# Patient Record
Sex: Male | Born: 2000 | Race: Black or African American | Hispanic: No | Marital: Single | Smoking: Former smoker
Health system: Southern US, Community
[De-identification: ages and names within clinical notes are randomized; demographics above are authoritative.]

## PROBLEM LIST (undated history)

## (undated) DIAGNOSIS — D571 Sickle-cell disease without crisis: Secondary | ICD-10-CM

---
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
## 2019-03-23 IMAGING — DX DG CHEST 2V
2 series · 2 of 2 positions shown · non-contrast
Comparison: 08/28/2017

CLINICAL DATA: Sickle cell crisis

EXAM:
CHEST - 2 VIEW

[chest pa]
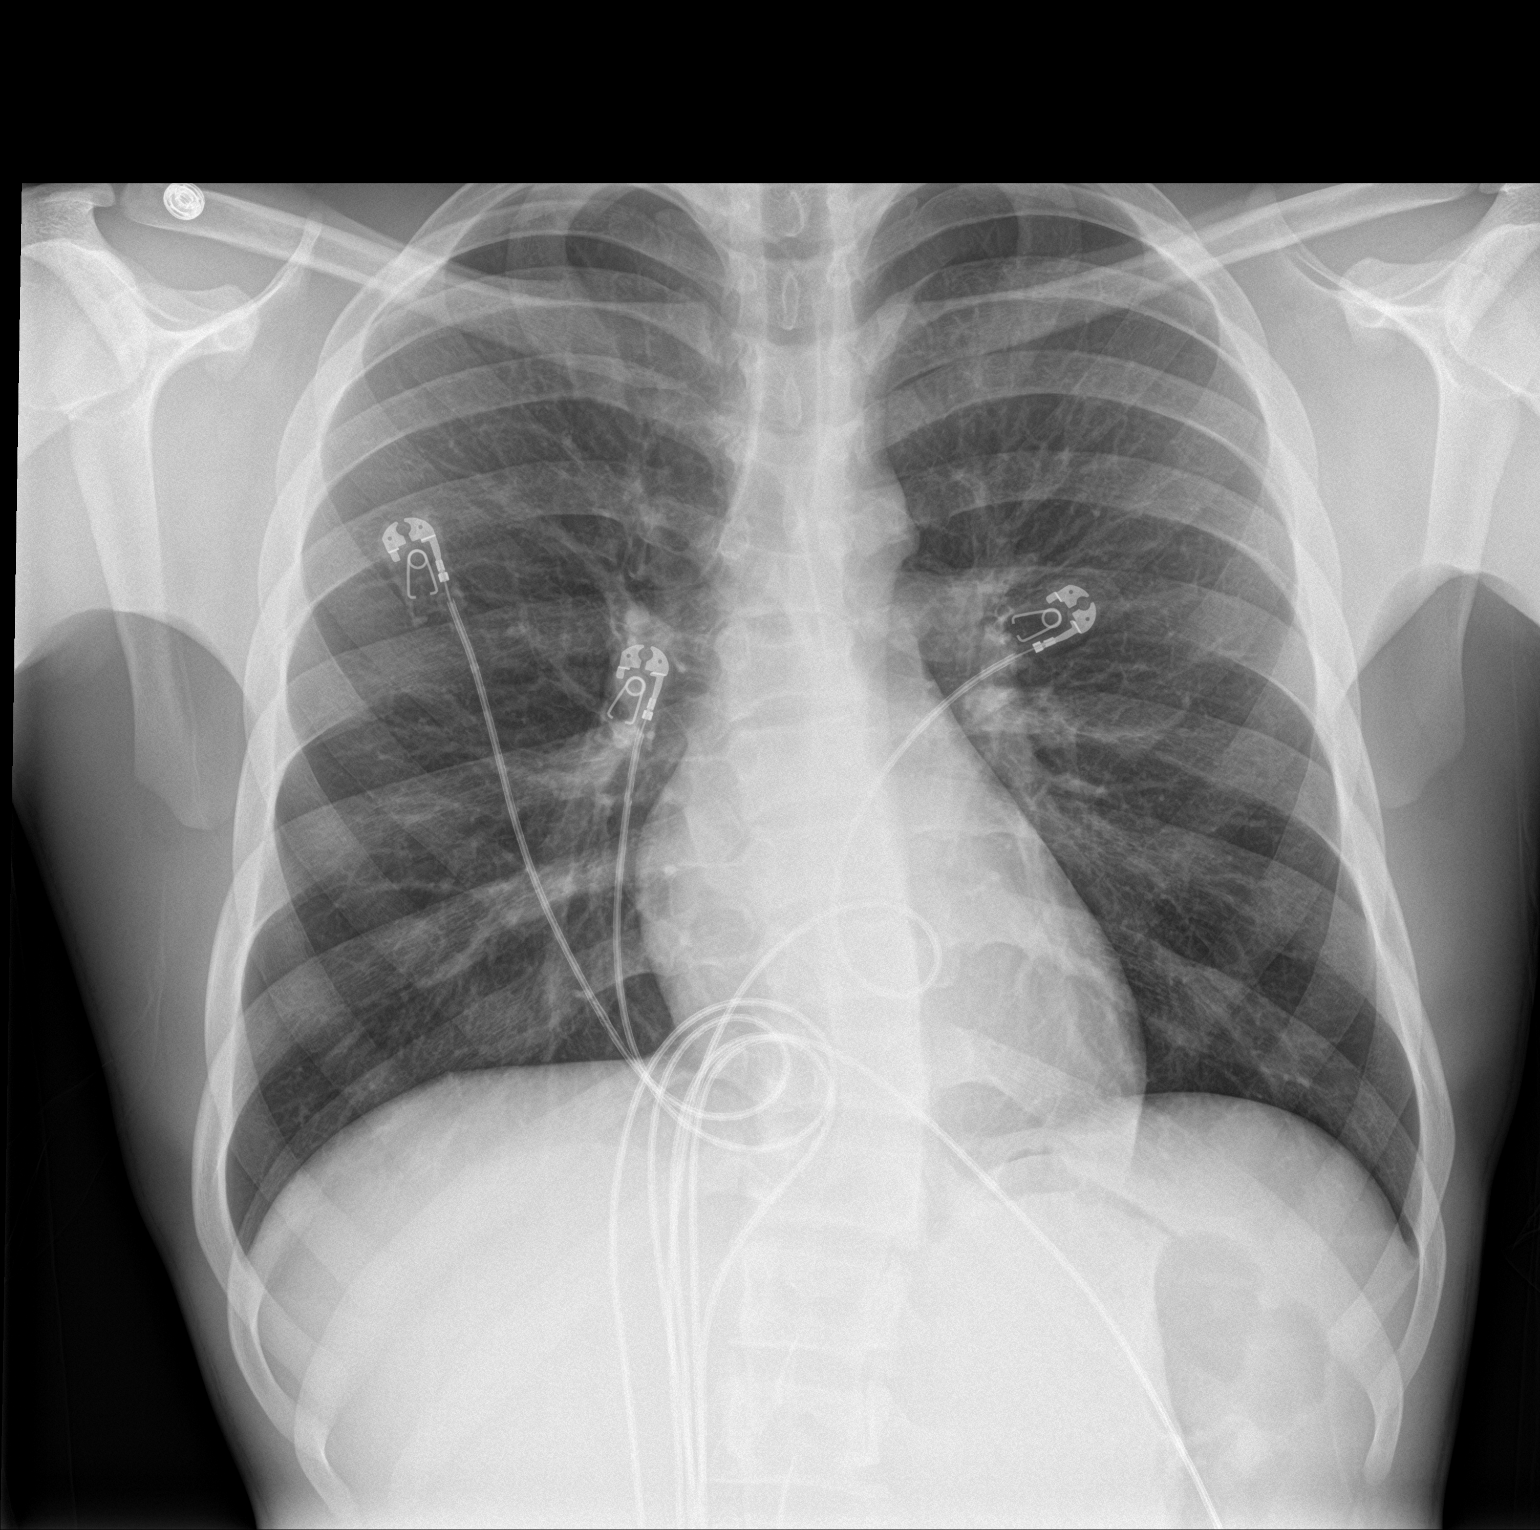

[chest lat]
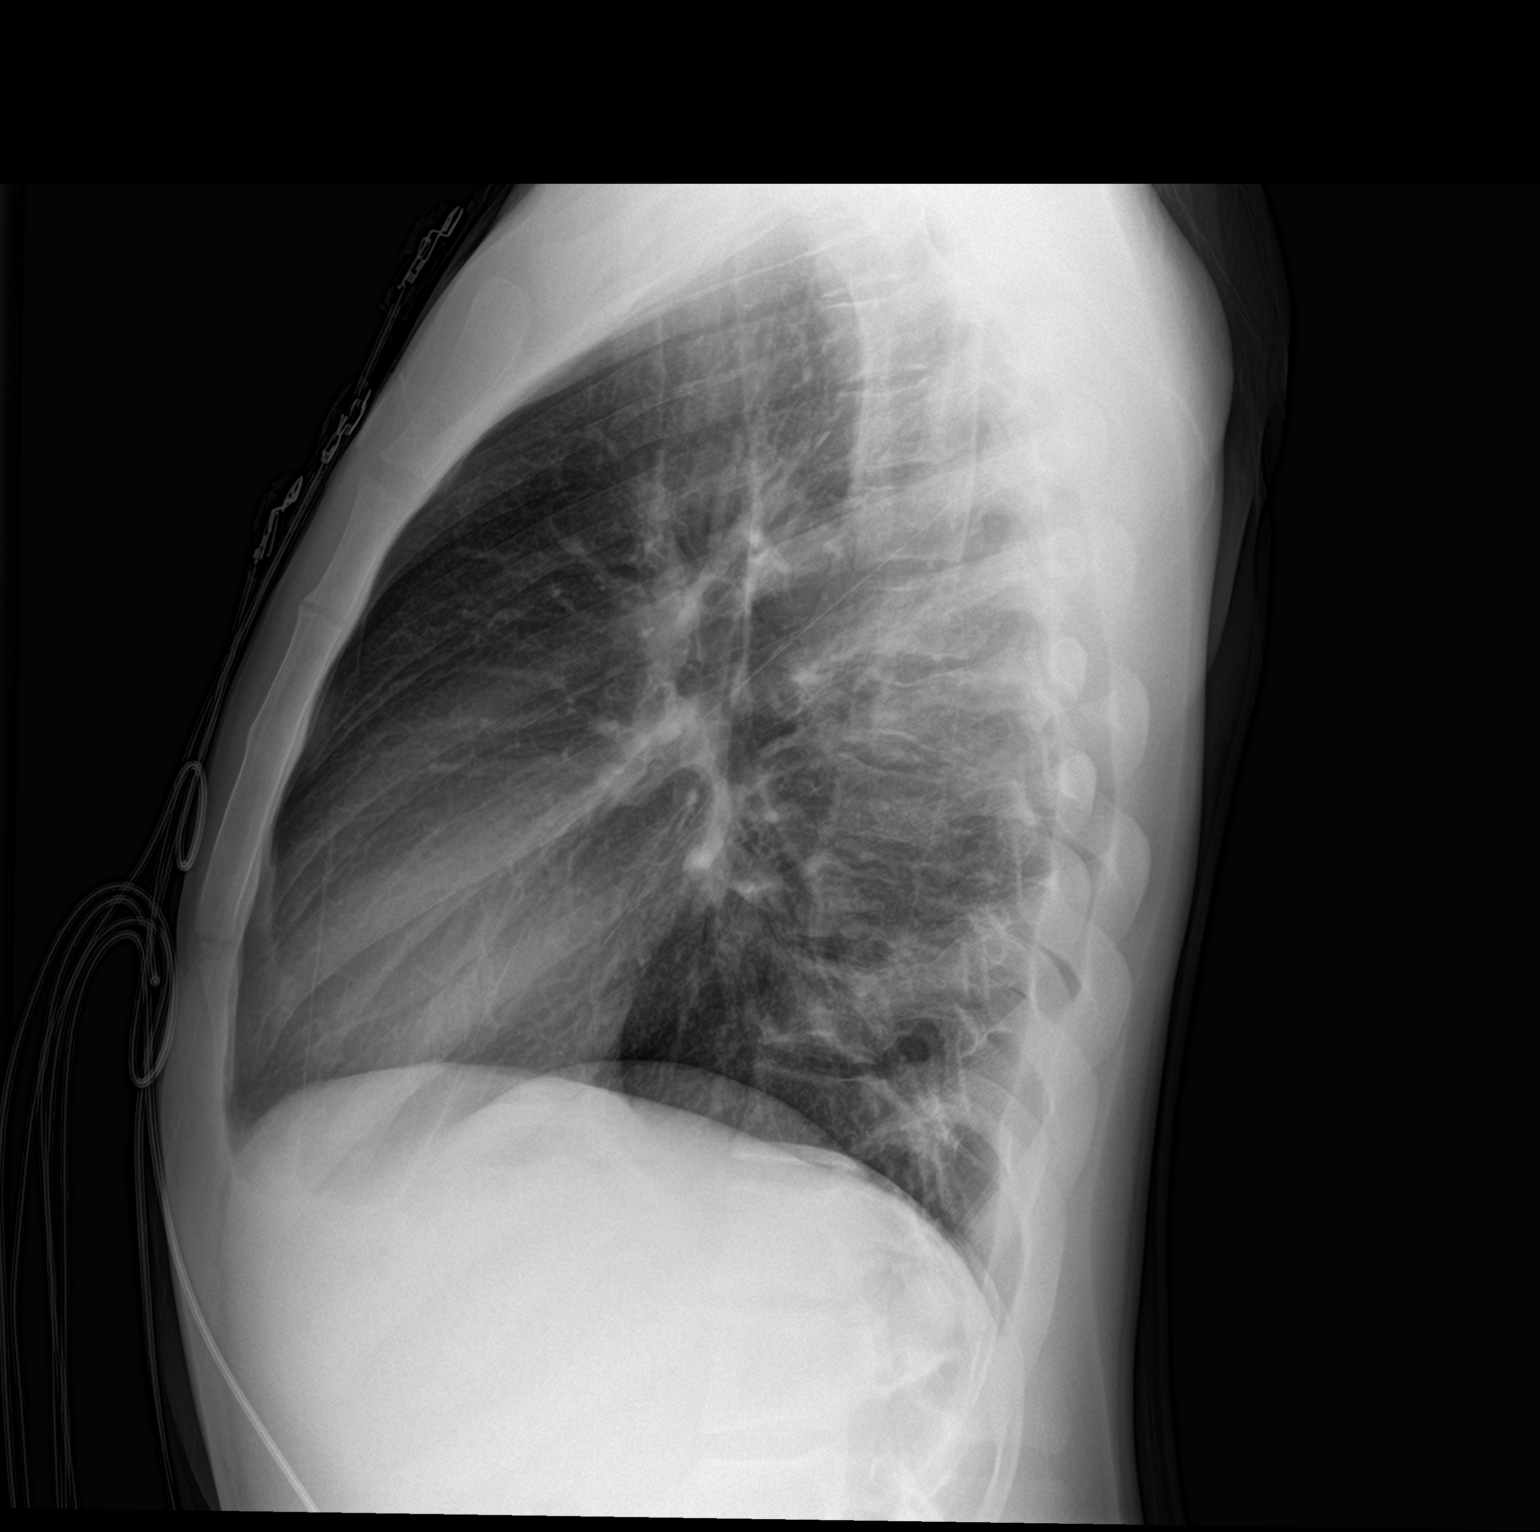

[2 of 2 positions shown; findings below may reference images not displayed]

FINDINGS: Normal heart size, mediastinal contours, and pulmonary vascularity.

Lungs clear.

No pulmonary infiltrate, pleural effusion, or pneumothorax.

Osseous changes of sickle disease at the thoracic vertebra with mild
biconvex thoracic scoliosis.

No acute bony findings.
IMPRESSION: No acute abnormalities.
